# Patient Record
Sex: Male | Born: 1970 | Race: White | Hispanic: No | Marital: Married | Smoking: Never smoker
Health system: Southern US, Community
[De-identification: ages and names within clinical notes are randomized; demographics above are authoritative.]

---

## 2004-06-18 ENCOUNTER — Ambulatory Visit: Payer: Self-pay | Admitting: Family Medicine

## 2006-01-20 ENCOUNTER — Encounter: Admission: RE | Admit: 2006-01-20 | Discharge: 2006-01-20 | Payer: Self-pay | Admitting: Sports Medicine

## 2014-02-26 ENCOUNTER — Other Ambulatory Visit: Payer: Self-pay | Admitting: Neurosurgery

## 2014-02-26 DIAGNOSIS — M5020 Other cervical disc displacement, unspecified cervical region: Secondary | ICD-10-CM

## 2014-02-27 ENCOUNTER — Ambulatory Visit
Admission: RE | Admit: 2014-02-27 | Discharge: 2014-02-27 | Disposition: A | Discharge status: Home / Self Care | Payer: BLUE CROSS/BLUE SHIELD | Source: Ambulatory Visit | Attending: Neurosurgery | Admitting: Neurosurgery

## 2014-02-27 DIAGNOSIS — M5020 Other cervical disc displacement, unspecified cervical region: Secondary | ICD-10-CM

## 2014-02-27 DIAGNOSIS — M5030 Other cervical disc degeneration, unspecified cervical region: Secondary | ICD-10-CM

## 2014-02-27 MED ORDER — IOHEXOL 300 MG/ML  SOLN
1.0000 mL | Freq: Once | INTRAMUSCULAR | Status: AC | PRN
  Administered 2014-02-27: 1 mL via EPIDURAL

## 2014-02-27 MED ORDER — TRIAMCINOLONE ACETONIDE 40 MG/ML IJ SUSP (RADIOLOGY)
60.0000 mg | Freq: Once | INTRAMUSCULAR | Status: AC
  Administered 2014-02-27: 60 mg via EPIDURAL

## 2014-02-27 NOTE — Discharge Instructions (Signed)

## 2017-10-11 ENCOUNTER — Other Ambulatory Visit: Payer: Self-pay | Admitting: Neurosurgery

## 2017-10-11 DIAGNOSIS — M5412 Radiculopathy, cervical region: Secondary | ICD-10-CM

## 2017-10-26 ENCOUNTER — Ambulatory Visit
Admission: RE | Admit: 2017-10-26 | Discharge: 2017-10-26 | Disposition: A | Discharge status: Home / Self Care | Payer: No Typology Code available for payment source | Source: Ambulatory Visit | Attending: Neurosurgery | Admitting: Neurosurgery

## 2017-10-26 DIAGNOSIS — M5412 Radiculopathy, cervical region: Secondary | ICD-10-CM

## 2017-10-26 MED ORDER — IOPAMIDOL (ISOVUE-M 200) INJECTION 41%
1.0000 mL | Freq: Once | INTRAMUSCULAR | Status: AC
  Administered 2017-10-26: 1 mL via EPIDURAL

## 2017-10-26 MED ORDER — METHYLPREDNISOLONE ACETATE 40 MG/ML INJ SUSP (RADIOLOG
120.0000 mg | Freq: Once | INTRAMUSCULAR | Status: AC
  Administered 2017-10-26: 120 mg via EPIDURAL

## 2017-10-26 NOTE — Discharge Instructions (Signed)

## 2018-12-16 ENCOUNTER — Other Ambulatory Visit: Payer: Self-pay | Admitting: Neurosurgery

## 2018-12-16 DIAGNOSIS — M5412 Radiculopathy, cervical region: Secondary | ICD-10-CM

## 2018-12-20 ENCOUNTER — Other Ambulatory Visit: Payer: Self-pay

## 2018-12-20 ENCOUNTER — Ambulatory Visit
Admission: RE | Admit: 2018-12-20 | Discharge: 2018-12-20 | Disposition: A | Discharge status: Home / Self Care | Payer: BLUE CROSS/BLUE SHIELD | Source: Ambulatory Visit | Attending: Neurosurgery | Admitting: Neurosurgery

## 2018-12-20 DIAGNOSIS — M5412 Radiculopathy, cervical region: Secondary | ICD-10-CM

## 2018-12-20 MED ORDER — TRIAMCINOLONE ACETONIDE 40 MG/ML IJ SUSP (RADIOLOGY)
60.0000 mg | Freq: Once | INTRAMUSCULAR | Status: AC
  Administered 2018-12-20: 08:00:00 60 mg via EPIDURAL

## 2018-12-20 MED ORDER — IOPAMIDOL (ISOVUE-M 300) INJECTION 61%
1.0000 mL | Freq: Once | INTRAMUSCULAR | Status: AC
  Administered 2018-12-20: 08:00:00 1 mL via EPIDURAL

## 2018-12-20 NOTE — Discharge Instructions (Signed)

## 2019-10-26 ENCOUNTER — Other Ambulatory Visit: Payer: Self-pay | Admitting: Physician Assistant

## 2019-10-26 DIAGNOSIS — M5412 Radiculopathy, cervical region: Secondary | ICD-10-CM

## 2019-10-30 ENCOUNTER — Other Ambulatory Visit: Payer: Self-pay

## 2019-10-30 ENCOUNTER — Ambulatory Visit
Admission: RE | Admit: 2019-10-30 | Discharge: 2019-10-30 | Disposition: A | Discharge status: Home / Self Care | Payer: BLUE CROSS/BLUE SHIELD | Source: Ambulatory Visit | Attending: Physician Assistant | Admitting: Physician Assistant

## 2019-10-30 DIAGNOSIS — M5412 Radiculopathy, cervical region: Secondary | ICD-10-CM

## 2019-10-30 MED ORDER — IOPAMIDOL (ISOVUE-M 300) INJECTION 61%
1.0000 mL | Freq: Once | INTRAMUSCULAR | Status: AC | PRN
  Administered 2019-10-30: 1 mL via EPIDURAL

## 2019-10-30 MED ORDER — TRIAMCINOLONE ACETONIDE 40 MG/ML IJ SUSP (RADIOLOGY)
60.0000 mg | Freq: Once | INTRAMUSCULAR | Status: AC
  Administered 2019-10-30: 60 mg via EPIDURAL

## 2019-10-30 NOTE — Discharge Instructions (Signed)

## 2020-09-13 ENCOUNTER — Other Ambulatory Visit: Payer: Self-pay | Admitting: Neurosurgery

## 2020-09-13 DIAGNOSIS — M5412 Radiculopathy, cervical region: Secondary | ICD-10-CM

## 2020-09-19 ENCOUNTER — Other Ambulatory Visit: Payer: Self-pay

## 2020-09-19 ENCOUNTER — Ambulatory Visit
Admission: RE | Admit: 2020-09-19 | Discharge: 2020-09-19 | Disposition: A | Discharge status: Home / Self Care | Payer: BLUE CROSS/BLUE SHIELD | Source: Ambulatory Visit | Attending: Neurosurgery | Admitting: Neurosurgery

## 2020-09-19 DIAGNOSIS — M5412 Radiculopathy, cervical region: Secondary | ICD-10-CM

## 2020-09-19 MED ORDER — TRIAMCINOLONE ACETONIDE 40 MG/ML IJ SUSP (RADIOLOGY)
60.0000 mg | Freq: Once | INTRAMUSCULAR | Status: AC
  Administered 2020-09-19: 60 mg via EPIDURAL

## 2020-09-19 MED ORDER — IOPAMIDOL (ISOVUE-M 300) INJECTION 61%
1.0000 mL | Freq: Once | INTRAMUSCULAR | Status: AC | PRN
  Administered 2020-09-19: 1 mL via EPIDURAL

## 2020-09-19 NOTE — Discharge Instructions (Signed)

## 2021-04-21 ENCOUNTER — Other Ambulatory Visit: Payer: Self-pay | Admitting: Neurosurgery

## 2021-04-21 DIAGNOSIS — M5412 Radiculopathy, cervical region: Secondary | ICD-10-CM

## 2021-04-28 ENCOUNTER — Other Ambulatory Visit: Payer: Self-pay

## 2021-04-28 ENCOUNTER — Ambulatory Visit
Admission: RE | Admit: 2021-04-28 | Discharge: 2021-04-28 | Disposition: A | Discharge status: Home / Self Care | Payer: No Typology Code available for payment source | Source: Ambulatory Visit | Attending: Neurosurgery | Admitting: Neurosurgery

## 2021-04-28 DIAGNOSIS — M5412 Radiculopathy, cervical region: Secondary | ICD-10-CM

## 2021-04-28 MED ORDER — IOPAMIDOL (ISOVUE-M 300) INJECTION 61%
1.0000 mL | Freq: Once | INTRAMUSCULAR | Status: AC | PRN
  Administered 2021-04-28: 1 mL via EPIDURAL

## 2021-04-28 MED ORDER — TRIAMCINOLONE ACETONIDE 40 MG/ML IJ SUSP (RADIOLOGY)
60.0000 mg | Freq: Once | INTRAMUSCULAR | Status: AC
  Administered 2021-04-28: 60 mg via EPIDURAL

## 2021-09-29 ENCOUNTER — Other Ambulatory Visit: Payer: Self-pay | Admitting: Neurosurgery

## 2021-09-29 DIAGNOSIS — M5412 Radiculopathy, cervical region: Secondary | ICD-10-CM

## 2021-10-13 ENCOUNTER — Ambulatory Visit
Admission: RE | Admit: 2021-10-13 | Discharge: 2021-10-13 | Disposition: A | Discharge status: Home / Self Care | Payer: No Typology Code available for payment source | Source: Ambulatory Visit | Attending: Neurosurgery | Admitting: Neurosurgery

## 2021-10-13 DIAGNOSIS — M5412 Radiculopathy, cervical region: Secondary | ICD-10-CM

## 2021-10-13 MED ORDER — IOPAMIDOL (ISOVUE-M 300) INJECTION 61%
1.0000 mL | Freq: Once | INTRAMUSCULAR | Status: AC | PRN
  Administered 2021-10-13: 1 mL via EPIDURAL

## 2021-10-13 MED ORDER — TRIAMCINOLONE ACETONIDE 40 MG/ML IJ SUSP (RADIOLOGY)
60.0000 mg | Freq: Once | INTRAMUSCULAR | Status: AC
  Administered 2021-10-13: 60 mg via EPIDURAL

## 2021-10-13 NOTE — Discharge Instructions (Signed)

## 2022-05-27 ENCOUNTER — Other Ambulatory Visit: Payer: Self-pay | Admitting: Neurosurgery

## 2022-05-27 DIAGNOSIS — M50223 Other cervical disc displacement at C6-C7 level: Secondary | ICD-10-CM

## 2022-06-01 ENCOUNTER — Other Ambulatory Visit: Payer: Self-pay | Admitting: Neurosurgery

## 2022-06-01 DIAGNOSIS — M50223 Other cervical disc displacement at C6-C7 level: Secondary | ICD-10-CM

## 2022-06-04 NOTE — Discharge Instructions (Signed)

## 2022-06-05 ENCOUNTER — Ambulatory Visit
Admission: RE | Admit: 2022-06-05 | Discharge: 2022-06-05 | Disposition: A | Discharge status: Home / Self Care | Payer: Self-pay | Source: Ambulatory Visit | Attending: Neurosurgery | Admitting: Neurosurgery

## 2022-06-05 DIAGNOSIS — M50223 Other cervical disc displacement at C6-C7 level: Secondary | ICD-10-CM

## 2022-06-05 MED ORDER — IOPAMIDOL (ISOVUE-M 300) INJECTION 61%
1.0000 mL | Freq: Once | INTRAMUSCULAR | Status: AC | PRN
  Administered 2022-06-05: 1 mL via EPIDURAL

## 2022-06-05 MED ORDER — TRIAMCINOLONE ACETONIDE 40 MG/ML IJ SUSP (RADIOLOGY)
60.0000 mg | Freq: Once | INTRAMUSCULAR | Status: AC
  Administered 2022-06-05: 60 mg via EPIDURAL

## 2022-12-07 ENCOUNTER — Other Ambulatory Visit: Payer: Self-pay | Admitting: Neurosurgery

## 2022-12-07 DIAGNOSIS — M50223 Other cervical disc displacement at C6-C7 level: Secondary | ICD-10-CM

## 2022-12-15 NOTE — Discharge Instructions (Signed)

## 2022-12-16 ENCOUNTER — Ambulatory Visit
Admission: RE | Admit: 2022-12-16 | Discharge: 2022-12-16 | Disposition: A | Discharge status: Home / Self Care | Payer: Self-pay | Source: Ambulatory Visit | Attending: Neurosurgery | Admitting: Neurosurgery

## 2022-12-16 DIAGNOSIS — M50223 Other cervical disc displacement at C6-C7 level: Secondary | ICD-10-CM

## 2022-12-16 MED ORDER — TRIAMCINOLONE ACETONIDE 40 MG/ML IJ SUSP (RADIOLOGY)
60.0000 mg | Freq: Once | INTRAMUSCULAR | Status: AC
  Administered 2022-12-16: 60 mg via EPIDURAL

## 2022-12-16 MED ORDER — IOPAMIDOL (ISOVUE-M 300) INJECTION 61%
1.0000 mL | Freq: Once | INTRAMUSCULAR | Status: AC | PRN
  Administered 2022-12-16: 1 mL via EPIDURAL

## 2023-03-22 IMAGING — XA DG INJECT/[PERSON_NAME] INC NEEDLE/CATH/PLC EPI/CERV/THOR W/IMG
2 series · 2 of 2 positions shown · non-contrast
Comparison: none

CLINICAL DATA: Cervical radiculopathy.

[Series 1: ortho standard · 1 of 1 slices shown (1 of 2)]
[im 1/1]
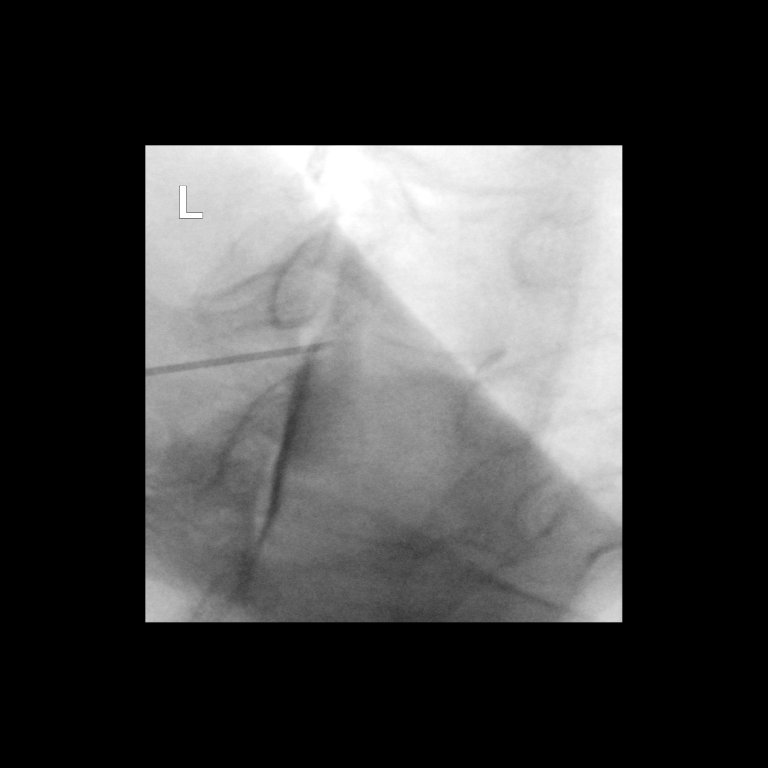

[Series 2: ortho standard · 1 of 1 slices shown (2 of 2)]
[im 1/1]
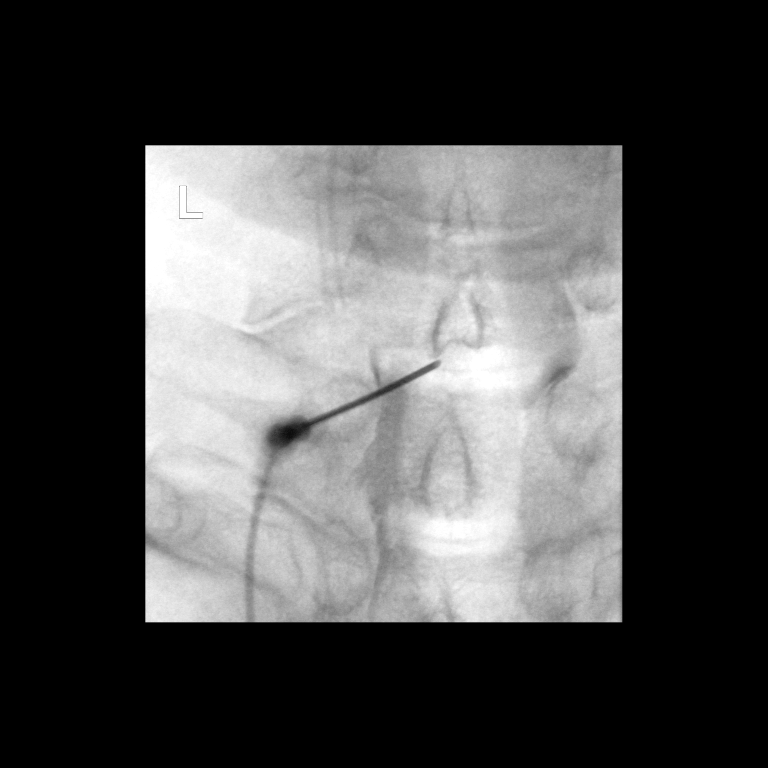

[2 of 2 positions shown; findings below may reference images not displayed]

FLUOROSCOPY TIME:  0 minutes 21 seconds. 13.29 micro gray meter
squared

PROCEDURE:
CERVICAL EPIDURAL INJECTION

An interlaminar approach was performed on the left at C7-T1. A 20
gauge epidural needle was advanced using loss-of-resistance
technique.

DIAGNOSTIC EPIDURAL INJECTION

Injection of Isovue-M 300 shows a good epidural pattern with spread
above and below the level of needle placement, primarily on the
left. No vascular opacification is seen. THERAPEUTIC

EPIDURAL INJECTION

1.5 ml of Kenalog 40 mixed with 1 ml of 1% Lidocaine and 2 ml of
normal saline were then instilled. The procedure was well-tolerated,
and the patient was discharged thirty minutes following the
injection in good condition.
IMPRESSION: Technically successful repeat epidural injection on the left at
C7-T1.

## 2023-08-11 ENCOUNTER — Other Ambulatory Visit: Payer: Self-pay | Admitting: Neurosurgery

## 2023-08-11 DIAGNOSIS — M5412 Radiculopathy, cervical region: Secondary | ICD-10-CM

## 2023-08-12 ENCOUNTER — Other Ambulatory Visit: Payer: Self-pay | Admitting: Neurosurgery

## 2023-08-12 DIAGNOSIS — M5412 Radiculopathy, cervical region: Secondary | ICD-10-CM

## 2023-08-19 NOTE — Discharge Instructions (Signed)

## 2023-08-20 ENCOUNTER — Ambulatory Visit
Admission: RE | Admit: 2023-08-20 | Discharge: 2023-08-20 | Disposition: A | Discharge status: Home / Self Care | Source: Ambulatory Visit | Attending: Neurosurgery | Admitting: Neurosurgery

## 2023-08-20 DIAGNOSIS — M5412 Radiculopathy, cervical region: Secondary | ICD-10-CM

## 2023-08-20 MED ORDER — IOPAMIDOL (ISOVUE-M 300) INJECTION 61%
1.0000 mL | Freq: Once | INTRAMUSCULAR | Status: AC | PRN
  Administered 2023-08-20: 1 mL via EPIDURAL

## 2023-08-20 MED ORDER — TRIAMCINOLONE ACETONIDE 40 MG/ML IJ SUSP (RADIOLOGY)
60.0000 mg | Freq: Once | INTRAMUSCULAR | Status: AC
  Administered 2023-08-20: 60 mg via EPIDURAL
